# Patient Record
Sex: Female | Born: 1960 | Race: White | Hispanic: No | Marital: Single | Smoking: Never smoker
Health system: Southern US, Community
[De-identification: ages and names within clinical notes are randomized; demographics above are authoritative.]

## PROBLEM LIST (undated history)

## (undated) DIAGNOSIS — R011 Cardiac murmur, unspecified: Secondary | ICD-10-CM

## (undated) DIAGNOSIS — I10 Essential (primary) hypertension: Secondary | ICD-10-CM

## (undated) HISTORY — PX: OTHER SURGICAL HISTORY: SHX169

## (undated) HISTORY — DX: Cardiac murmur, unspecified: R01.1

---

## 2015-10-13 ENCOUNTER — Encounter: Payer: Self-pay | Admitting: Cardiology

## 2015-10-17 ENCOUNTER — Emergency Department
Admission: EM | Admit: 2015-10-17 | Discharge: 2015-10-18 | Disposition: A | Discharge status: Home / Self Care | Payer: BLUE CROSS/BLUE SHIELD | Attending: Emergency Medicine | Admitting: Emergency Medicine

## 2015-10-17 ENCOUNTER — Emergency Department: Payer: BLUE CROSS/BLUE SHIELD

## 2015-10-17 ENCOUNTER — Encounter: Payer: Self-pay | Admitting: Emergency Medicine

## 2015-10-17 DIAGNOSIS — Z79899 Other long term (current) drug therapy: Secondary | ICD-10-CM | POA: Insufficient documentation

## 2015-10-17 DIAGNOSIS — I10 Essential (primary) hypertension: Secondary | ICD-10-CM | POA: Insufficient documentation

## 2015-10-17 DIAGNOSIS — R079 Chest pain, unspecified: Secondary | ICD-10-CM | POA: Diagnosis not present

## 2015-10-17 HISTORY — DX: Essential (primary) hypertension: I10

## 2015-10-17 LAB — BASIC METABOLIC PANEL
Anion gap: 7 (ref 5–15)
BUN: 14 mg/dL (ref 6–20)
CALCIUM: 9.5 mg/dL (ref 8.9–10.3)
CO2: 26 mmol/L (ref 22–32)
CREATININE: 0.54 mg/dL (ref 0.44–1.00)
Chloride: 103 mmol/L (ref 101–111)
GFR calc Af Amer: >60 mL/min (ref >60)
GFR calc non Af Amer: >60 mL/min (ref >60)
GLUCOSE: 104 mg/dL — AB (ref 65–99)
Potassium: 3.6 mmol/L (ref 3.5–5.1)
Sodium: 136 mmol/L (ref 135–145)

## 2015-10-17 LAB — CBC
HCT: 39.8 % (ref 35.0–47.0)
Hemoglobin: 13.3 g/dL (ref 12.0–16.0)
MCH: 30.5 pg (ref 26.0–34.0)
MCHC: 33.5 g/dL (ref 32.0–36.0)
MCV: 91 fL (ref 80.0–100.0)
PLATELETS: 241 10*3/uL (ref 150–440)
RBC: 4.37 MIL/uL (ref 3.80–5.20)
RDW: 13.4 % (ref 11.5–14.5)
WBC: 7.7 10*3/uL (ref 3.6–11.0)

## 2015-10-17 NOTE — ED Provider Notes (Signed)
[facility] Emergency Department Provider Note  ____________________________________________  Time seen: Approximately 11:55 PM  I have reviewed the triage vital signs and the nursing notes.   HISTORY  Chief Complaint Chest Pain   HPI Amber Glover is a 55 y.o. [place]. Past medical history is hypertension for which she takes valsartan. Today at lunch she began feeling shaky and nauseated and woozy. Blood pressure was elevated at about 4:00 she developed chest pressure with no radiation. She went to lay down in bed for about 20 minutes blood pressure came down a little bit she took a second valsartan pressure has since resolved. Patient has no history of shortness of breath or sweating.    Past Medical History  Diagnosis Date  . Hypertension     There are no active problems to display for this patient.   History reviewed. No pertinent past surgical history.  Current Outpatient Rx  Name  Route  Sig  Dispense  Refill  . amLODipine (NORVASC) 5 MG tablet   Oral   Take 1 tablet by mouth daily.      1   . valsartan (DIOVAN) 80 MG tablet   Oral   Take 1 tablet by mouth daily.      2     Allergies Caffeine  History reviewed. No pertinent family history.  Social History Social History  Substance Use Topics  . Smoking status: Never Smoker   . Smokeless tobacco: None  . Alcohol Use: None   family history significant for father having had a stroke in his 6750s  Review of Systems Constitutional: No fever/chills Eyes: No visual changes. ENT: No sore throat. Cardiovascular: Denies chest pain at present. Respiratory: Denies shortness of breath. Gastrointestinal: No abdominal pain.  No nausea, no vomiting.  No diarrhea.  No constipation. Genitourinary: Negative for dysuria. Musculoskeletal: Negative for back pain. Skin: Negative for rash. Neurological: Negative for headaches, focal weakness or  numbness.  10-point ROS otherwise negative.  ____________________________________________   PHYSICAL EXAM:  VITAL SIGNS: ED Triage Vitals  Enc Vitals Group     BP 10/17/15 1928 155/72 mmHg     Pulse Rate 10/17/15 1928 81     Resp 10/17/15 1928 18     Temp 10/17/15 1928 98.4 F (36.9 C)     Temp Source 10/17/15 1928 Oral     SpO2 10/17/15 1928 99 %     Weight 10/17/15 1926 113 lb (51.256 kg)     Height 10/17/15 1926 5\' 2"  (1.575 m)     Head Cir --      Peak Flow --      Pain Score 10/17/15 1927 5     Pain Loc --      Pain Edu? --      Excl. in GC? --     Constitutional: Alert and oriented. Well appearing and in no acute distress. Eyes: Conjunctivae are normal. PERRL. EOMI. Head: Atraumatic. Nose: No congestion/rhinnorhea. Mouth/Throat: Mucous membranes are moist.  Oropharynx non-erythematous. Neck: No stridor.   Cardiovascular: Normal rate, regular rhythm. Grossly normal heart sounds.  Good peripheral circulation. Respiratory: Normal respiratory effort.  No retractions. Lungs CTAB. Gastrointestinal: Soft and nontender. No distention. No abdominal bruits. No CVA tenderness. Musculoskeletal: No lower extremity tenderness nor edema.  No joint effusions. Neurologic:  Normal speech and language. No gross focal neurologic deficits are appreciated.     ____________________________________________   LABS (all labs ordered are listed, but only abnormal results  are displayed)  Labs Reviewed  BASIC METABOLIC PANEL - Abnormal; Notable for the following:    Glucose, Bld 104 (*)    All other components within normal limits  CBC  TROPONIN I   ____________________________________________  EKG  EKG read and interpreted by me shows normal sinus rhythm rate of 68 normal axis essentially normal EKG ____________________________________________  RADIOLOGY  Radiology reads the chest x-ray as no acute  disease ____________________________________________   PROCEDURES    ____________________________________________   INITIAL IMPRESSION / ASSESSMENT AND PLAN / ED COURSE  Pertinent labs & imaging results that were available during my care of the patient were reviewed by me and considered in my medical decision making (see chart for details).   ____________________________________________   FINAL CLINICAL IMPRESSION(S) / ED DIAGNOSES  Final diagnoses:  Chest pain, unspecified chest pain type      Arnaldo Natal, MD 10/21/15 1121

## 2015-10-17 NOTE — ED Notes (Signed)
Patient reports feeling "funny" - shaking, nausea and dizzy right after lunch.  Approximately 1 hour prior to arrival began having left chest pressure with continued nausea.  Patient reports recent decrease in blood pressure medicine.

## 2015-10-18 LAB — TROPONIN I

## 2015-10-20 ENCOUNTER — Telehealth: Payer: Self-pay | Admitting: Cardiovascular Disease

## 2015-10-20 NOTE — Telephone Encounter (Signed)
LMOV for patient to call back and make ED fu from 10/17/15

## 2015-11-06 ENCOUNTER — Ambulatory Visit: Payer: BLUE CROSS/BLUE SHIELD | Admitting: Cardiology

## 2015-11-20 ENCOUNTER — Encounter (INDEPENDENT_AMBULATORY_CARE_PROVIDER_SITE_OTHER): Payer: Self-pay

## 2015-11-20 ENCOUNTER — Encounter: Payer: Self-pay | Admitting: Cardiology

## 2015-11-20 ENCOUNTER — Ambulatory Visit (INDEPENDENT_AMBULATORY_CARE_PROVIDER_SITE_OTHER): Payer: BLUE CROSS/BLUE SHIELD | Admitting: Cardiology

## 2015-11-20 VITALS — BP 140/72 | HR 73 | Ht 62.0 in | Wt 110.5 lb

## 2015-11-20 DIAGNOSIS — R079 Chest pain, unspecified: Secondary | ICD-10-CM | POA: Diagnosis not present

## 2015-11-20 DIAGNOSIS — I10 Essential (primary) hypertension: Secondary | ICD-10-CM

## 2015-11-20 DIAGNOSIS — R011 Cardiac murmur, unspecified: Secondary | ICD-10-CM | POA: Diagnosis not present

## 2015-11-20 NOTE — Progress Notes (Signed)
Cardiology Office Note   Date:  11/20/2015   ID:  Amber Glover, DOB 18-Jul-1961, MRN [id number]  Referring Doctor:  Lanier Ensign KEY, MD   Cardiologist:   Almond Lint, MD   Reason for consultation:  Chief Complaint  Patient presents with  . other    F/u ED due to chest pain no complaints today. Meds reviewed verbally with pt.      History of Present Illness: Amber Glover is a 55 y.o. female who presents for evaluation of chest pain, at the request PCP.recently, in the last several months, patient has noticed episodes were in her blood pressure all of a sudden shoot up to 190s to 200s systolic. When this happens she develops tightness in the chest, nonradiating, mild in intensity, lasts minutes at a time period resolves her blood pressure improves. She cannot really explain why her blood pressure shoots up like that. She never had anything like this before. She feels that this is probably stress related. She is in the process of looking for a house to live. She moved from [place] recently and currently she is living with a friend.  Patient denies headache, fever, cough, colds. No denies shortness of breath other than when she rides a horse. She feels her shortness breath is in proportion to the level of exertion. No PND, orthopnea, edema. No loss of consciousness.   ROS:  Please see the history of present illness. Aside from mentioned under HPI, all other systems are reviewed and negative.     Past Medical History  Diagnosis Date  . Hypertension   . Heart murmur     Past Surgical History  Procedure Laterality Date  . Cesarean section    . Uterine ablation       reports that she has never smoked. She does not have any smokeless tobacco history on file. She reports that she drinks alcohol. She reports that she does not use illicit drugs.   family history includes Heart Problems in her father. father had a defibrillator. She isn't aware exactly his diagnosis.  Current  Outpatient Prescriptions  Medication Sig Dispense Refill  . Ascorbic Acid (VITAMIN C PO) Take by mouth daily.    . cholecalciferol (VITAMIN D) 1000 units tablet Take 1,000 Units by mouth daily.    . Multiple Vitamin (MULTIVITAMIN) tablet Take 1 tablet by mouth daily.    . NON FORMULARY Relief Takes 1 tablet by mouth daily.    . valsartan (DIOVAN) 80 MG tablet Take 1 tablet by mouth daily.  2   No current facility-administered medications for this visit.    Allergies: Caffeine    PHYSICAL EXAM: VS:  BP 140/72 mmHg  Pulse 73  Ht  (1.575 m)  Wt 110 lb 8 oz (50.122 kg)  BMI 20.21 kg/m2 , Body mass index is 20.21 kg/(m^2). Wt Readings from Last 3 Encounters:  11/20/15 110 lb 8 oz (50.122 kg)  10/17/15 113 lb (51.256 kg)    GENERAL:  well developed, well nourished, not in acute distress HEENT: normocephalic, pink conjunctivae, anicteric sclerae, no xanthelasma, normal dentition, oropharynx clear NECK:  no neck vein engorgement, JVP normal, no hepatojugular reflux, carotid upstroke brisk and symmetric, no bruit, no thyromegaly, no lymphadenopathy LUNGS:  good respiratory effort, clear to auscultation bilaterally CV:  PMI not displaced, no thrills, no lifts, S1 and S2 within normal limits, no palpable S3 or S4, no murmurs, no rubs, no gallops, Soft systolic murmur ABD:  Soft, nontender, nondistended, normoactive bowel  sounds, no abdominal aortic bruit, no hepatomegaly, no splenomegaly MS: nontender back, no kyphosis, no scoliosis, no joint deformities EXT:  2+ DP/PT pulses, no edema, no varicosities, no cyanosis, no clubbing SKIN: warm, nondiaphoretic, normal turgor, no ulcers NEUROPSYCH: alert, oriented to person, place, and time, sensory/motor grossly intact, normal mood, appropriate affect  Recent Labs: 10/17/2015: BUN 14; Creatinine, Ser 0.54; Hemoglobin 13.3; Platelets 241; Potassium 3.6; Sodium 136   Lipid Panel No results found for: CHOL, TRIG, HDL, CHOLHDL, VLDL, LDLCALC,  LDLDIRECT   Other studies Reviewed:  EKG:  The ekg from 10/18/2015 was personally reviewed by me and it revealed sinus rhythm, 60 BPM.  Additional studies/ records that were reviewed personally reviewed by me today include: none available   ASSESSMENT AND PLAN:  Chest pain Atypical, may be related to elevated blood pressure Risk factors for coronary disease include hypertension, question of family history. Recommend echocardiogram and exercise stress test for reassurance. Patient to follow-up after tests.  Systolic murmur Recommend echocardiogram  Hypertension Continue monitoring BP. Continue current medical therapy and lifestyle changes.  Current medicines are reviewed at length with the patient today.  The patient does not have concerns regarding medicines.  Labs/ tests ordered today include:  Orders Placed This Encounter  Procedures  . NM Myocar Multi W/Spect W/Wall Motion / EF  . Echocardiogram    I had a lengthy and detailed discussion with the patient regarding diagnoses, prognosis, diagnostic options, treatment options .   I counseled the patient on importance of lifestyle modification including heart healthy diet, regular physical activity.   Disposition:   FU with undersigned after tests    Signed, Almond LintAileen Micala Saltsman, MD  11/20/2015 3:28 PM    [facility]

## 2015-11-20 NOTE — Patient Instructions (Addendum)
Medication Instructions:  Your physician recommends that you continue on your current medications as directed. Please refer to the Current Medication list given to you today.   Labwork: None ordered  Testing/Procedures: Your physician has requested that you have an echocardiogram. Echocardiography is a painless test that uses sound waves to create images of your heart. It provides your doctor with information about the size and shape of your heart and how well your heart's chambers and valves are working. This procedure takes approximately one hour. There are no restrictions for this procedure.  Date & Time:_________________________________________________________  [facility] MYOVIEW  Your caregiver has ordered a Stress Test with nuclear imaging. The purpose of this test is to evaluate the blood supply to your heart muscle. This procedure is referred to as a "Non-Invasive Stress Test." This is because other than having an IV started in your vein, nothing is inserted or "invades" your body. Cardiac stress tests are done to find areas of poor blood flow to the heart by determining the extent of coronary artery disease (CAD). Some patients exercise on a treadmill, which naturally increases the blood flow to your heart, while others who are  unable to walk on a treadmill due to physical limitations have a pharmacologic/chemical stress agent called Lexiscan . This medicine will mimic walking on a treadmill by temporarily increasing your coronary blood flow.   Please note: these test may take anywhere between 2-4 hours to complete  PLEASE REPORT TO [facility] MEDICAL MALL ENTRANCE  THE VOLUNTEERS AT THE FIRST DESK WILL DIRECT YOU WHERE TO GO  Date of Procedure:___Monday Dec 08, 2015 at 07:30AM_________  Arrival Time for Procedure:_____Arrive at 07:30AM______________   PLEASE NOTIFY THE OFFICE AT LEAST 24 HOURS IN ADVANCE IF YOU ARE UNABLE TO KEEP YOUR APPOINTMENT.  [phone] AND  PLEASE NOTIFY NUCLEAR  MEDICINE AT [facility] AT LEAST 24 HOURS IN ADVANCE IF YOU ARE UNABLE TO KEEP YOUR APPOINTMENT. [phone]  How to prepare for your Myoview test:   Do not eat or drink after midnight  No caffeine for 24 hours prior to test  No smoking 24 hours prior to test.  Your medication may be taken with water.  If your doctor stopped a medication because of this test, do not take that medication.  Ladies, please do not wear dresses.  Skirts or pants are appropriate. Please wear a short sleeve shirt.  No perfume, cologne or lotion.  Wear comfortable walking shoes. No heels!        Follow-Up: Your physician recommends that you schedule a follow-up appointment after testing to review results.  Date & Time: _______________________________________________________________  Any Other Special Instructions Will Be Listed Below (If Applicable).     If you need a refill on your cardiac medications before your next appointment, please call your pharmacy.  Echocardiogram An echocardiogram, or echocardiography, uses sound waves (ultrasound) to produce an image of your heart. The echocardiogram is simple, painless, obtained within a short period of time, and offers valuable information to your health care provider. The images from an echocardiogram can provide information such as:  Evidence of coronary artery disease (CAD).  Heart size.  Heart muscle function.  Heart valve function.  Aneurysm detection.  Evidence of a past heart attack.  Fluid buildup around the heart.  Heart muscle thickening.  Assess heart valve function. LET [facility] CARE PROVIDER KNOW ABOUT:  Any allergies you have.  All medicines you are taking, including vitamins, herbs, eye drops, creams, and over-the-counter medicines.  Previous problems  you or members of your family have had with the use of anesthetics.  Any blood disorders you have.  Previous surgeries you have had.  Medical conditions you  have.  Possibility of pregnancy, if this applies. BEFORE THE PROCEDURE  No special preparation is needed. Eat and drink normally.  PROCEDURE   In order to produce an image of your heart, gel will be applied to your chest and a wand-like tool (transducer) will be moved over your chest. The gel will help transmit the sound waves from the transducer. The sound waves will harmlessly bounce off your heart to allow the heart images to be captured in real-time motion. These images will then be recorded.  You may need an IV to receive a medicine that improves the quality of the pictures. AFTER THE PROCEDURE You may return to your normal schedule including diet, activities, and medicines, unless your health care provider tells you otherwise.   This information is not intended to replace advice given to you by your health care provider. Make sure you discuss any questions you have with your health care provider.   Document Released: 07/16/2000 Document Revised: 08/09/2014 Document Reviewed: 03/26/2013 Elsevier Interactive Patient Education 2016 Elsevier Inc.   Cardiac Nuclear Scanning A cardiac nuclear scan is used to check your heart for problems, such as the following:  A portion of the heart is not getting enough blood.  Part of the heart muscle has died, which happens with a heart attack.  The heart wall is not working normally.  In this test, a radioactive dye (tracer) is injected into your bloodstream. After the tracer has traveled to your heart, a scanning device is used to measure how much of the tracer is absorbed by or distributed to various areas of your heart. LET [facility] CARE PROVIDER KNOW ABOUT:  Any allergies you have.  All medicines you are taking, including vitamins, herbs, eye drops, creams, and over-the-counter medicines.  Previous problems you or members of your family have had with the use of anesthetics.  Any blood disorders you have.  Previous surgeries you  have had.  Medical conditions you have.  RISKS AND COMPLICATIONS Generally, this is a safe procedure. However, as with any procedure, problems can occur. Possible problems include:   Serious chest pain.  Rapid heartbeat.  Sensation of warmth in your chest. This usually passes quickly. BEFORE THE PROCEDURE Ask your health care provider about changing or stopping your regular medicines. PROCEDURE This procedure is usually done at a hospital and takes 2-4 hours.  An IV tube is inserted into one of your veins.  Your health care provider will inject a small amount of radioactive tracer through the tube.  You will then wait for 20-40 minutes while the tracer travels through your bloodstream.  You will lie down on an exam table so images of your heart can be taken. Images will be taken for about 15-20 minutes.  You will exercise on a treadmill or stationary bike. While you exercise, your heart activity will be monitored with an electrocardiogram (ECG), and your blood pressure will be checked.  If you are unable to exercise, you may be given a medicine to make your heart beat faster.  When blood flow to your heart has peaked, tracer will again be injected through the IV tube.  After 20-40 minutes, you will get back on the exam table and have more images taken of your heart.  When the procedure is over, your IV tube will be  removed. AFTER THE PROCEDURE  You will likely be able to leave shortly after the test. Unless your health care provider tells you otherwise, you may return to your normal schedule, including diet, activities, and medicines.  Make sure you find out how and when you will get your test results.   This information is not intended to replace advice given to you by your health care provider. Make sure you discuss any questions you have with your health care provider.   Document Released: 08/13/2004 Document Revised: 07/24/2013 Document Reviewed: 06/27/2013 Elsevier  Interactive Patient Education [employer].

## 2015-11-26 ENCOUNTER — Other Ambulatory Visit: Payer: BLUE CROSS/BLUE SHIELD

## 2015-12-02 ENCOUNTER — Ambulatory Visit: Payer: BLUE CROSS/BLUE SHIELD | Admitting: Cardiology

## 2015-12-08 ENCOUNTER — Other Ambulatory Visit: Payer: BLUE CROSS/BLUE SHIELD

## 2015-12-09 ENCOUNTER — Ambulatory Visit (INDEPENDENT_AMBULATORY_CARE_PROVIDER_SITE_OTHER): Payer: BLUE CROSS/BLUE SHIELD

## 2015-12-09 ENCOUNTER — Other Ambulatory Visit: Payer: Self-pay

## 2015-12-09 DIAGNOSIS — R079 Chest pain, unspecified: Secondary | ICD-10-CM

## 2015-12-09 DIAGNOSIS — I10 Essential (primary) hypertension: Secondary | ICD-10-CM | POA: Diagnosis not present

## 2015-12-09 DIAGNOSIS — R011 Cardiac murmur, unspecified: Secondary | ICD-10-CM | POA: Diagnosis not present

## 2015-12-11 ENCOUNTER — Ambulatory Visit: Payer: BLUE CROSS/BLUE SHIELD | Admitting: Cardiology

## 2015-12-15 ENCOUNTER — Ambulatory Visit: Payer: BLUE CROSS/BLUE SHIELD

## 2015-12-24 ENCOUNTER — Ambulatory Visit: Payer: BLUE CROSS/BLUE SHIELD | Admitting: Cardiology

## 2017-06-30 ENCOUNTER — Other Ambulatory Visit: Payer: Self-pay

## 2017-06-30 ENCOUNTER — Emergency Department: Payer: BLUE CROSS/BLUE SHIELD

## 2017-06-30 ENCOUNTER — Emergency Department
Admission: EM | Admit: 2017-06-30 | Discharge: 2017-06-30 | Disposition: A | Discharge status: Home / Self Care | Payer: BLUE CROSS/BLUE SHIELD | Attending: Emergency Medicine | Admitting: Emergency Medicine

## 2017-06-30 DIAGNOSIS — Z79899 Other long term (current) drug therapy: Secondary | ICD-10-CM | POA: Insufficient documentation

## 2017-06-30 DIAGNOSIS — W19XXXA Unspecified fall, initial encounter: Secondary | ICD-10-CM | POA: Diagnosis not present

## 2017-06-30 DIAGNOSIS — Y939 Activity, unspecified: Secondary | ICD-10-CM | POA: Insufficient documentation

## 2017-06-30 DIAGNOSIS — Y999 Unspecified external cause status: Secondary | ICD-10-CM | POA: Diagnosis not present

## 2017-06-30 DIAGNOSIS — S0990XA Unspecified injury of head, initial encounter: Secondary | ICD-10-CM | POA: Diagnosis present

## 2017-06-30 DIAGNOSIS — S0083XA Contusion of other part of head, initial encounter: Secondary | ICD-10-CM

## 2017-06-30 DIAGNOSIS — Y929 Unspecified place or not applicable: Secondary | ICD-10-CM | POA: Insufficient documentation

## 2017-06-30 DIAGNOSIS — S0003XA Contusion of scalp, initial encounter: Secondary | ICD-10-CM | POA: Insufficient documentation

## 2017-06-30 NOTE — ED Provider Notes (Signed)
[facility] Emergency Department Provider Note   ____________________________________________   None    (approximate)  I have reviewed the triage vital signs and the nursing notes.   HISTORY  Chief Complaint Fall and Head Injury    HPI Amber Glover is a 56 y.o. female patient complain right facial pain and numbness to the top or his secondary to a trip and fall 4 days ago. Patient denies LOC. Patient denies vision disturbance or vertigo. Patient stated this tenderness inferior right orbital area. Receiving hematoma to the right supraorbital area.Patient rates the pain as a 4/10. Patient described it as "achy". Supportive measures consist of Tylenol and ice packs.   Past Medical History:  Diagnosis Date  . Heart murmur   . Hypertension     There are no active problems to display for this patient.   Past Surgical History:  Procedure Laterality Date  . CESAREAN SECTION    . uterine ablation      Prior to Admission medications   Medication Sig Start Date End Date Taking? Authorizing Provider  Ascorbic Acid (VITAMIN C PO) Take by mouth daily.    [provider]  cholecalciferol (VITAMIN D) 1000 units tablet Take 1,000 Units by mouth daily.    [provider]  Multiple Vitamin (MULTIVITAMIN) tablet Take 1 tablet by mouth daily.    [provider]  NON FORMULARY Relief Takes 1 tablet by mouth daily.    [provider]  valsartan (DIOVAN) 80 MG tablet Take 1 tablet by mouth daily. 10/13/15   [provider]    Allergies Caffeine  Family History  Problem Relation Age of Onset  . Heart Problems Father     Social History Social History   Tobacco Use  . Smoking status: Never Smoker  . Smokeless tobacco: Never Used  Substance Use Topics  . Alcohol use: Yes    Comment: social  . Drug use: No    Review of Systems  Constitutional: No fever/chills Eyes: No visual changes. ENT: No sore  throat. Cardiovascular: Denies chest pain. Respiratory: Denies shortness of breath. Gastrointestinal: No abdominal pain.  No nausea, no vomiting.  No diarrhea.  No constipation. Genitourinary: Negative for dysuria. Musculoskeletal: Negative for back pain. Skin: Negative for rash. Neurological: Negative for headaches, focal weakness or numbness. Endocrine:Hypertension Allergic/Immunilogical: Caffeine ____________________________________________   PHYSICAL EXAM:  VITAL SIGNS: ED Triage Vitals  Enc Vitals Group     BP 06/30/17 0928 (!) 164/71     Pulse Rate 06/30/17 0928 80     Resp 06/30/17 0928 17     Temp 06/30/17 0928 98 F (36.7 C)     Temp Source 06/30/17 0928 Tympanic     SpO2 06/30/17 0928 98 %     Weight 06/30/17 0928 100 lb (45.4 kg)     Height 06/30/17 0928 5\' 2"  (1.575 m)     Head Circumference --      Peak Flow --      Pain Score 06/30/17 0927 4     Pain Loc --      Pain Edu? --      Excl. in GC? --    Constitutional: Alert and oriented. Well appearing and in no acute distress. Eyes: Conjunctivae are normal. PERRL. EOMI. Head: Atraumatic. Nose: No congestion/rhinnorhea. Mouth/Throat: Mucous membranes are moist.  Oropharynx non-erythematous. Neck: No stridor.  No cervical spine tenderness to palpation. Hematological/Lymphatic/Immunilogical: No cervical lymphadenopathy. Cardiovascular: Normal rate, regular rhythm. Grossly normal heart sounds.  Good peripheral  circulation. Respiratory: Normal respiratory effort.  No retractions. Lungs CTAB. Neurologic:  Normal speech and language. No gross focal neurologic deficits are appreciated. No gait instability. Skin:  Skin is warm, dry and intact. No rash noted. Right inferior orbital ecchymosis Psychiatric: Mood and affect are normal. Speech and behavior are normal.  ____________________________________________   LABS (all labs ordered are listed, but only abnormal results are displayed)  Labs Reviewed - No data to  display ____________________________________________  EKG   ____________________________________________  RADIOLOGY  Ct Head Wo Contrast  Result Date: 06/30/2017 CLINICAL DATA:  Injury to the right side of the forehead due to a ground level fall the night of 06/25/2017. Initial encounter. EXAM: CT HEAD WITHOUT CONTRAST CT MAXILLOFACIAL WITHOUT CONTRAST TECHNIQUE: Multidetector CT imaging of the head and maxillofacial structures were performed using the standard protocol without intravenous contrast. Multiplanar CT image reconstructions of the maxillofacial structures were also generated. COMPARISON:  None. FINDINGS: CT HEAD FINDINGS Brain: No acute abnormality including hemorrhage, infarct, mass lesion, mass effect, midline shift or abnormal extra-axial fluid collection is identified. No hydrocephalus or pneumocephalus. Vascular: No hyperdense vessel or unexpected calcification. Skull: Intact. Other: None. CT MAXILLOFACIAL FINDINGS Osseous: No fracture or mandibular dislocation. No destructive process. Mild reversal of the normal cervical lordosis is noted. Orbits: Negative. No traumatic or inflammatory finding. Sinuses: Clear. Soft tissues: No radiopaque foreign body or soft tissue gas. IMPRESSION: No acute abnormality head or cervical spine. Electronically Signed   By: Drusilla Kannerhomas  Dalessio M.D.   On: 06/30/2017 11:13   Ct Maxillofacial Wo Contrast  Result Date: 06/30/2017 CLINICAL DATA:  Injury to the right side of the forehead due to a ground level fall the night of 06/25/2017. Initial encounter. EXAM: CT HEAD WITHOUT CONTRAST CT MAXILLOFACIAL WITHOUT CONTRAST TECHNIQUE: Multidetector CT imaging of the head and maxillofacial structures were performed using the standard protocol without intravenous contrast. Multiplanar CT image reconstructions of the maxillofacial structures were also generated. COMPARISON:  None. FINDINGS: CT HEAD FINDINGS Brain: No acute abnormality including hemorrhage, infarct,  mass lesion, mass effect, midline shift or abnormal extra-axial fluid collection is identified. No hydrocephalus or pneumocephalus. Vascular: No hyperdense vessel or unexpected calcification. Skull: Intact. Other: None. CT MAXILLOFACIAL FINDINGS Osseous: No fracture or mandibular dislocation. No destructive process. Mild reversal of the normal cervical lordosis is noted. Orbits: Negative. No traumatic or inflammatory finding. Sinuses: Clear. Soft tissues: No radiopaque foreign body or soft tissue gas. IMPRESSION: No acute abnormality head or cervical spine. Electronically Signed   By: Drusilla Kannerhomas  Dalessio M.D.   On: 06/30/2017 11:13    ____________________________________________   PROCEDURES  Procedure(s) performed: None  Procedures  Critical Care performed: No  ____________________________________________   INITIAL IMPRESSION / ASSESSMENT AND PLAN / ED COURSE  As part of my medical decision making, I reviewed the following data within the electronic MEDICAL RECORD NUMBER    Pain secondary to facial scalp contusion. Discussed negative CT finding with patient. Patient given discharge care instructions and advised to take extra Tylenol for pain. Patient advised to follow up PCP.      ____________________________________________   FINAL CLINICAL IMPRESSION(S) / ED DIAGNOSES  Final diagnoses:  Contusion of face, initial encounter  Contusion of scalp, initial encounter     ED Discharge Orders    None       Note:  This document was prepared using Dragon voice recognition software and may include unintentional dictation errors.    Joni ReiningSmith, Aloise Copus K, PA-C 06/30/17 1150    Jene EveryKinner, Robert, MD 06/30/17  1310  

## 2017-06-30 NOTE — ED Triage Notes (Addendum)
Pt states she tripped and fell into the side of the house Saturday night denies LOC. States since she has noticed numbness to the top of her head . Denies visual changes, N/V or any other neuro sx..Marland Kitchen

## 2017-06-30 NOTE — ED Notes (Signed)
See triage note  States she fell hit her head last Friday  Bruising noted around right eye and cheek area  States she is still having h/a  Mainly to top of head and states area feels numb

## 2017-06-30 NOTE — Discharge Instructions (Signed)
Advised to continue supportive care and take extra strength Tylenol for headache pain.

## 2018-01-06 IMAGING — CT CT HEAD W/O CM
3 of 6 series · 16 of 47 positions shown, 19 images · non-contrast
Comparison: None.

CLINICAL DATA: Injury to the right side of the forehead due to a
ground level fall the night of 06/25/2017. Initial encounter.

EXAM:
CT HEAD WITHOUT CONTRAST
CT MAXILLOFACIAL WITHOUT CONTRAST
TECHNIQUE: Multidetector CT imaging of the head and maxillofacial structures
were performed using the standard protocol without intravenous
contrast. Multiplanar CT image reconstructions of the maxillofacial
structures were also generated.

[Series 6: max soft · axial · 0.27mm/px · z∈[+430,+552]mm · 11 of 69 slices shown, 14 images]
[im 4/69  brain]
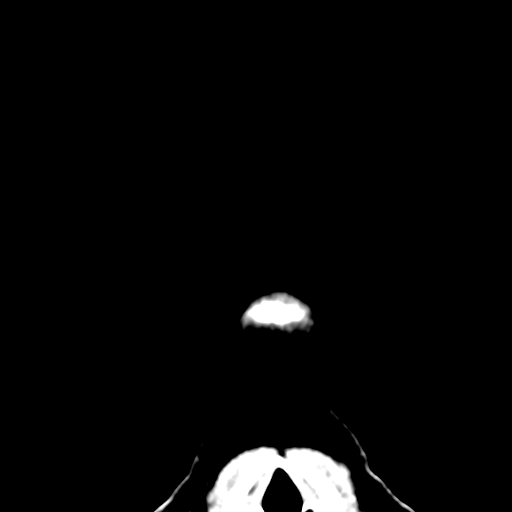
[im 4/69  bone]
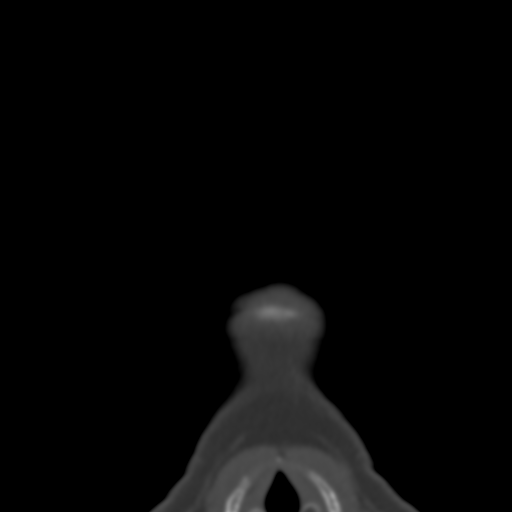
[im 10/69  brain]
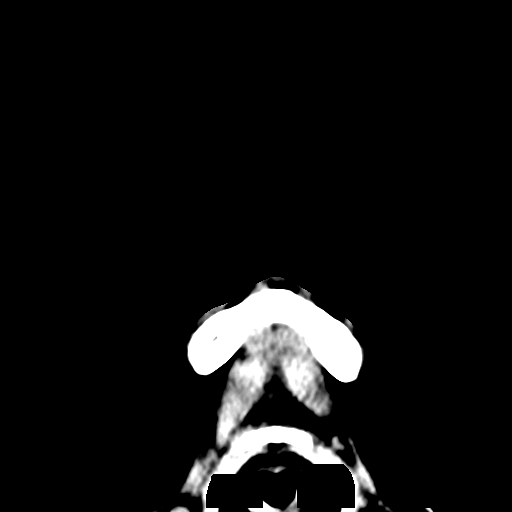
[im 17/69  brain]
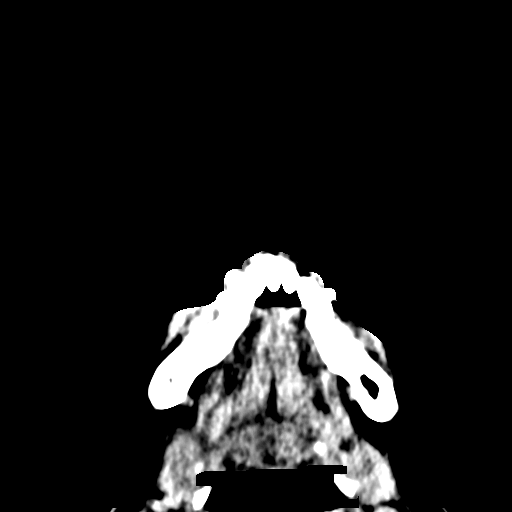
[im 23/69  brain]
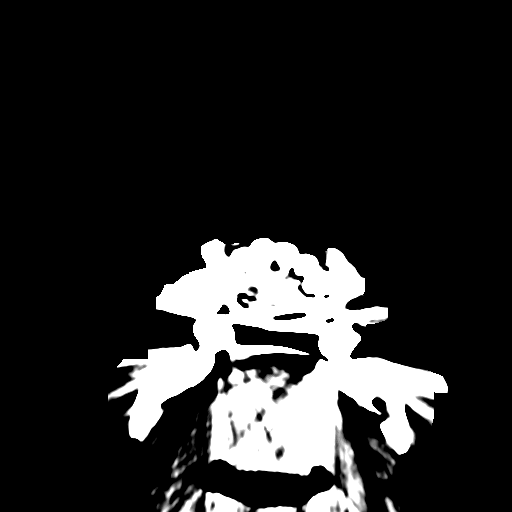
[im 30/69  brain]
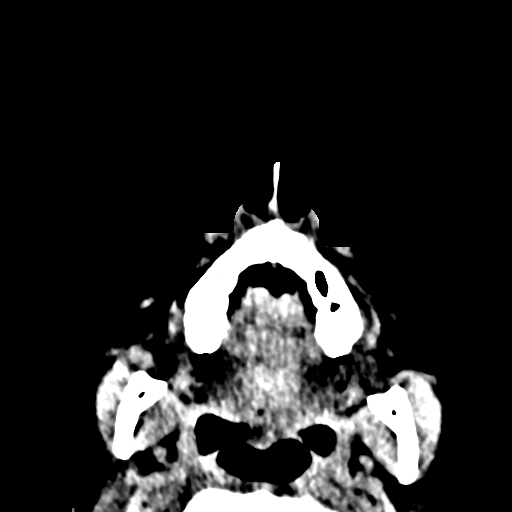
[im 30/69  bone]
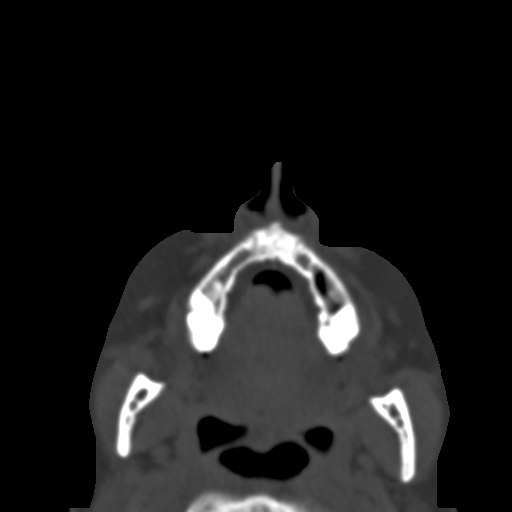
[im 36/69  brain]
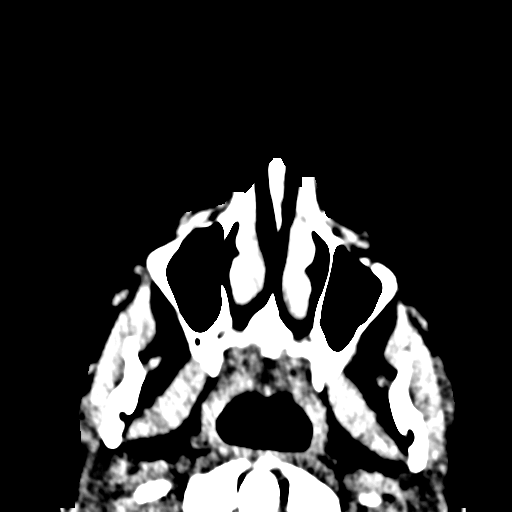
[im 39/69  brain]
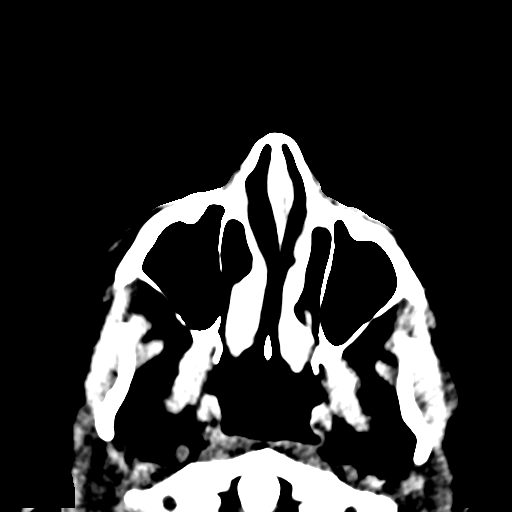
[im 46/69  brain]
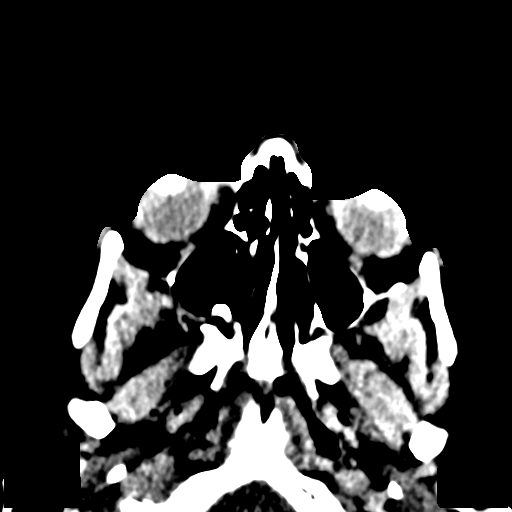
[im 52/69  brain]
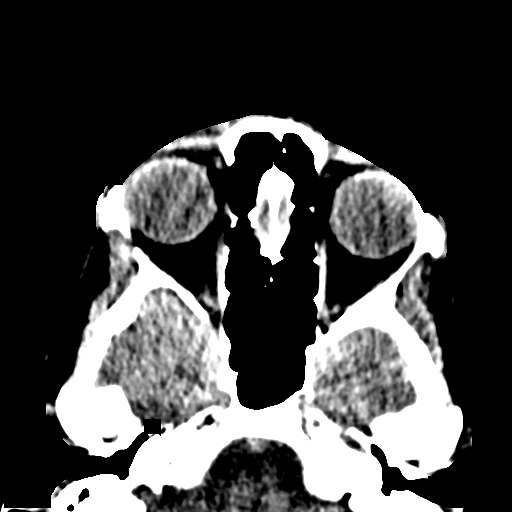
[im 52/69  bone]
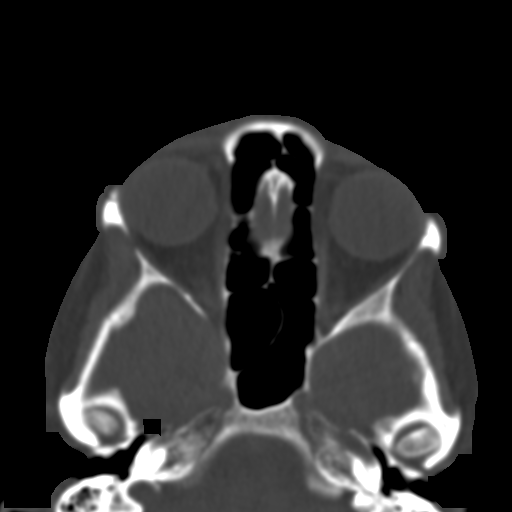
[im 59/69  brain]
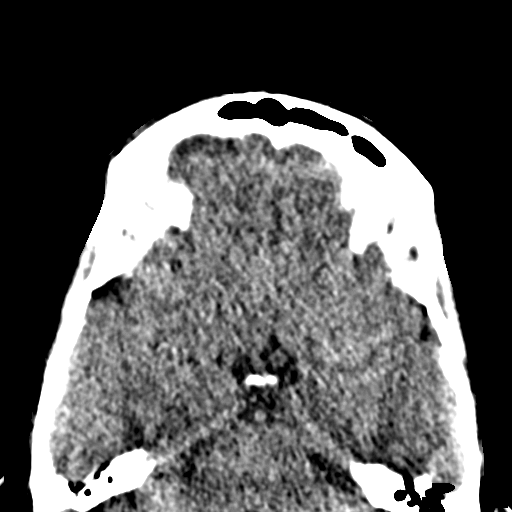
[im 65/69  brain]
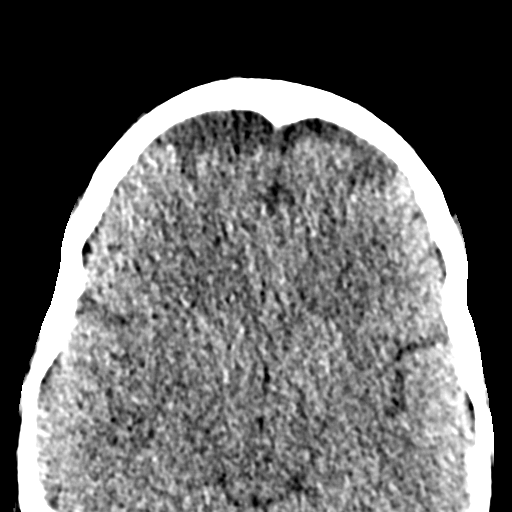

[Series 10: coronal soft · coronal · 0.35mm/px · 3 of 64 slices shown]
[im 8/64  brain]
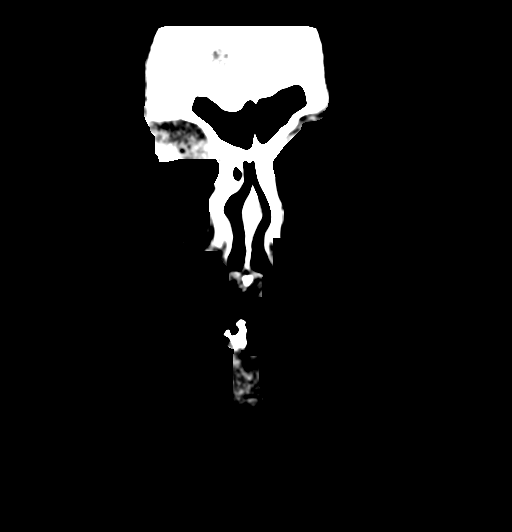
[im 22/64  brain]
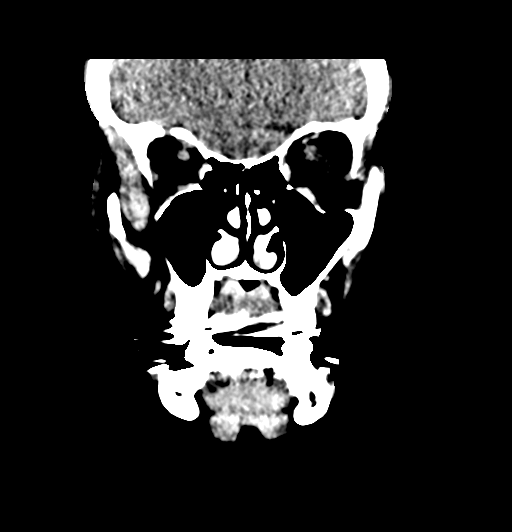
[im 36/64  brain]
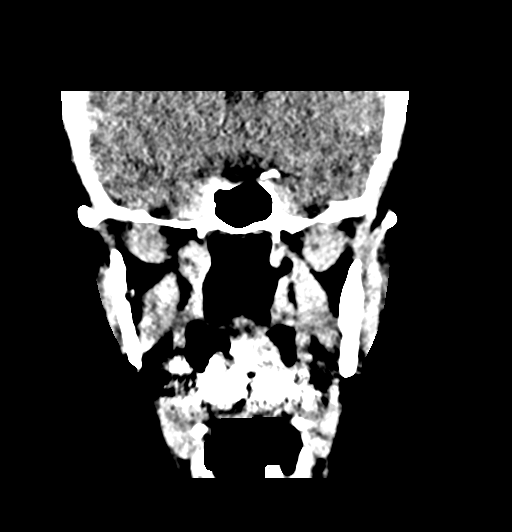

[Series 11: sagittal soft · sagittal · 0.33mm/px · 2 of 69 slices shown]
[im 23/69  brain]
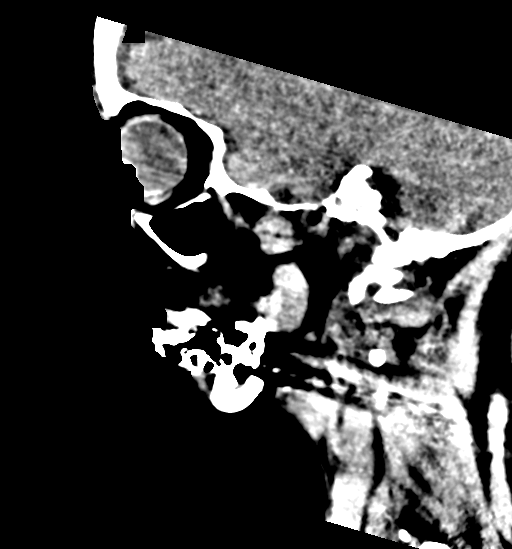
[im 46/69  brain]
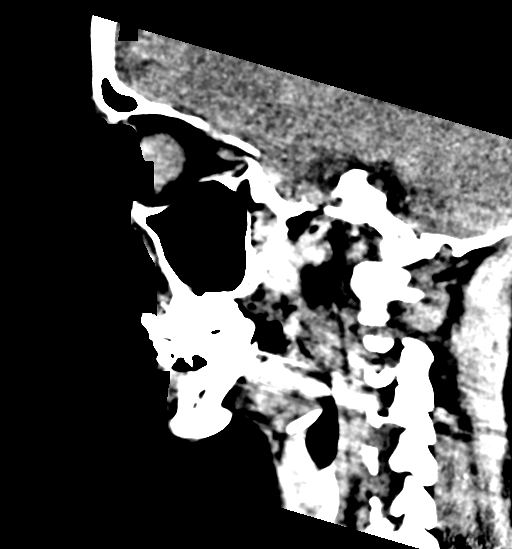

[16 of 47 positions shown; findings below may reference images not displayed]

FINDINGS: CT HEAD FINDINGS

Brain: No acute abnormality including hemorrhage, infarct, mass
lesion, mass effect, midline shift or abnormal extra-axial fluid
collection is identified. No hydrocephalus or pneumocephalus.

Vascular: No hyperdense vessel or unexpected calcification.

Skull: Intact.

Other: None.

CT MAXILLOFACIAL FINDINGS

Osseous: No fracture or mandibular dislocation. No destructive
process. Mild reversal of the normal cervical lordosis is noted.

Orbits: Negative. No traumatic or inflammatory finding.

Sinuses: Clear.

Soft tissues: No radiopaque foreign body or soft tissue gas.
IMPRESSION: No acute abnormality head or cervical spine.

## 2020-02-28 ENCOUNTER — Other Ambulatory Visit: Payer: Self-pay

## 2020-02-28 ENCOUNTER — Ambulatory Visit: Payer: BC Managed Care – PPO | Attending: Otolaryngology | Admitting: Speech Pathology

## 2020-02-28 DIAGNOSIS — R490 Dysphonia: Secondary | ICD-10-CM

## 2020-02-29 ENCOUNTER — Encounter: Payer: Self-pay | Admitting: Speech Pathology

## 2020-02-29 NOTE — Therapy (Signed)
[place] [facility] MAIN [facility] SERVICES [address] [place], [place], [address] Phone: [phone]   Fax:  [phone]  Speech Language Pathology Evaluation  Patient Details  Name: Amber Glover MRN: [id number] Date of Birth: 1961-03-04 Referring Provider (SLP): Bud Face   Encounter Date: 02/28/2020   End of Session - 02/29/20 1247    Visit Number 1    Number of Visits 17    Date for SLP Re-Evaluation 04/25/20    Authorization Type [employer]    Authorization Time Period 02/28/2020 - 04/25/2020    Authorization - Visit Number 1    Progress Note Due on Visit 10    SLP Start Time 1505    SLP Stop Time  1555    SLP Time Calculation (min) 50 min    Activity Tolerance Patient tolerated treatment well           Past Medical History:  Diagnosis Date  . Heart murmur   . Hypertension     Past Surgical History:  Procedure Laterality Date  . CESAREAN SECTION    . uterine ablation      There were no vitals filed for this visit.   Subjective Assessment - 02/29/20 1243    Subjective pt pleasant    Currently in Pain? No/denies              SLP Evaluation [facility] - 02/29/20 1243      SLP Visit Information   SLP Received On 02/28/20    Referring Provider (SLP) Bud Face    Onset Date 02/13/2020    Medical Diagnosis Dysphonia      Subjective   Subjective pt pleasant, conversant    Patient/Family Stated Goal for her voice to sound better      General Information   HPI This is a 59 year old female with compliant of chronic hoarseness.  Her hoarseness is described as loss of voice and raspy. Pt has been experiencing hoarseness for over a year and she loses her voice when talking for long periods of time and for several months last year she had a constant dry cough. Pt had ENT evaluation with laryngoscopy on 02/13/2020. 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      Behavioral/Cognition  appropriate, good historian    Mobility Status ambulatory      Balance Screen   Has the patient fallen in the past 6 months No    Has the patient had a decrease in activity level because of a fear of falling?  No    Is the patient reluctant to leave their home because of a fear of falling?  No      Prior Functional Status   Cognitive/Linguistic Baseline Within functional limits    Type of Home House     Lives With Family    Vocation Full time employment      Cognition   Overall Cognitive Status Within Functional Limits for tasks assessed      Auditory Comprehension   Overall Auditory Comprehension Appears within functional limits for tasks assessed      Reading Comprehension   Reading Status Within funtional limits      Expression   Primary Mode of Expression Verbal      Verbal Expression   Overall Verbal Expression Appears within functional limits for tasks assessed      Oral Motor/Sensory Function   Overall Oral Motor/Sensory Function Appears within functional limits for tasks assessed  Motor Speech   Overall Motor Speech Impaired    Respiration Within functional limits    Phonation Hoarse;Low vocal intensity    Resonance Within functional limits    Articulation Within functional limitis    Intelligibility Intelligible    Motor Planning Witnin functional limits    Motor Speech Errors Not applicable    Phonation Impaired    Vocal Abuses Prolonged Vocal Use    Tension Present Neck;Shoulder    Volume Soft    Pitch Appropriate           Voice Assessment:   Respiration:   Max phonation time of sustained "ah": average of 6 seconds (6.7, 5.3, 6 seconds)   Average time patient was able to sustain /s/: 6.5 seconds   Average time patient was able to sustain /z/: 3.9 seconds   s/z ratio: 1.6 (suggestive of dysfunction > 1.0)      Sound Pressure Level: 63 dB when reading and conversing   Pitch (Visi-Pitch): 241 Hz - considered within average of 244 Hz + 27        SLP Education - 02/29/20 1246    Education Details Behavioral Voice Therapy, POC, goals    Person(s) Educated Patient    Methods Explanation;Demonstration    Comprehension Verbalized understanding            SLP Short Term Goals - 02/29/20 1256      SLP SHORT TERM GOAL #1   Title The patient will maximize voice quality and loudness using breath support/oral resonance for sustained vowel production, pitch glides, and hierarchal speech drill with minimal assistance.    Baseline new goal    Time 10    Period --   sessions   Status New      SLP SHORT TERM GOAL #2   Title The patient will be moderately independent for abdominal breathing and breath support exercises.    Baseline new goal    Time 10    Period --   sessions   Status New      SLP SHORT TERM GOAL #3   Title The patient will decrease laryngeal and articulatory muscle tension by independently completing relaxation/stretching exercises.    Baseline new goal    Time 10    Period --   sessions     SLP SHORT TERM GOAL #4   Title Pt will identify environmental barriers to using a healthy voice and with moderate cues, create and implement a solution.    Baseline new goal    Time 10    Period --   sessions   Status New            SLP Long Term Goals - 02/29/20 1255      SLP LONG TERM GOAL #1   Title The patient will maximize voice quality and loudness using breath support/oral resonance for sustained vowel production, pitch glides, and hierarchal speech drill.    Baseline new goal    Time 8    Period Weeks    Status New    Target Date 04/25/20      SLP LONG TERM GOAL #2   Title The patient will be independent for abdominal breathing and breath support exercises.    Baseline new goal    Time 8    Period Weeks    Status New    Target Date 04/25/20      SLP LONG TERM GOAL #3   Title Pt will independently change environment to promote ability  to use a healthy voice.    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 In addition, pt further indicated that her voice makes it difficult for people to hear her, people have difficulty understanding her in a noisy environment, her voice restricts her social life, she ALWAYS has to strain to produce voice, the quality of her voice is ALWAYS unpredictable, her voice problem is ALWAYS upsetting to her and people frequently ask, "What's wrong with your voice?" The patient will benefit from voice therapy for education (vocal hygiene), to improve breath control for speech, and learn techniques to increase loudness and pitch range without strain.    Speech Therapy Frequency 2x / week    Duration --   8 weeks   Treatment/Interventions SLP instruction and feedback;Patient/family education    Potential to Achieve Goals Good    SLP Home Exercise Plan abdmoninal breathing exercises    Consulted and Agree with Plan of Care Patient           Patient will benefit from skilled therapeutic intervention in order to improve the following deficits and impairments:   Dysphonia    Problem List There are no problems to display for this patient.  Kyung Muto B. Dreama Saa M.S., CCC-SLP, [facility] Speech-Language Pathologist Rehabilitation Services Office [phone]  Coyle Stordahl B. Dreama Saa M.S., CCC-SLP, [facility] Speech-Language Pathologist Rehabilitation Services Office [phone]  Reuel Derby 02/29/2020, 1:01 PM  [place] [facility] MAIN [facility] SERVICES [address] [place], [place], [address] Phone: [phone]   Fax:  [phone]  Name: Amber Glover MRN: [id number] Date of Birth: Jul 27, 1961

## 2020-03-04 ENCOUNTER — Ambulatory Visit: Payer: BC Managed Care – PPO | Admitting: Speech Pathology

## 2020-03-06 ENCOUNTER — Ambulatory Visit: Payer: BC Managed Care – PPO | Admitting: Speech Pathology

## 2020-03-18 ENCOUNTER — Ambulatory Visit: Payer: BC Managed Care – PPO | Admitting: Speech Pathology

## 2020-03-20 ENCOUNTER — Other Ambulatory Visit: Payer: Self-pay

## 2020-03-20 ENCOUNTER — Ambulatory Visit: Payer: BC Managed Care – PPO | Attending: Otolaryngology | Admitting: Speech Pathology

## 2020-03-20 DIAGNOSIS — R490 Dysphonia: Secondary | ICD-10-CM | POA: Insufficient documentation

## 2020-03-21 ENCOUNTER — Encounter: Payer: Self-pay | Admitting: Speech Pathology

## 2020-03-21 NOTE — Therapy (Signed)
[place] MAIN [facility] SERVICES [address] [place], [place], [address] Phone: [phone]   Fax:  [phone]  Speech Language Pathology Treatment  Patient Details  Name: Amber Glover MRN: [id number] Date of Birth: [birth year] Referring Provider (SLP): Carloyn Manner   Encounter Date: 03/20/2020   End of Session - 03/21/20 0920    Visit Number 2    SLP Start Time 1300    SLP Stop Time  1400    SLP Time Calculation (min) 60 min    Activity Tolerance Patient tolerated treatment well           Past Medical History:  Diagnosis Date  . Heart murmur   . Hypertension     Past Surgical History:  Procedure Laterality Date  . CESAREAN SECTION    . uterine ablation      There were no vitals filed for this visit.   Subjective Assessment - 03/21/20 0917    Subjective pt pleasant    Currently in Pain? No/denies                 ADULT SLP TREATMENT - 03/21/20 0001      General Information   Behavior/Cognition Alert;Cooperative;Pleasant mood    HPI This is a 59 year old female with compliant of chronic hoarseness.  Her hoarseness is described as loss of voice and raspy. Pt has been experiencing hoarseness for over a year and she loses her voice when talking for long periods of time and for several months last year she had a constant dry cough. Pt had ENT evaluation with laryngoscopy on 02/13/2020. 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        Treatment Provided   Treatment provided Cognitive-Linquistic      Pain Assessment   Pain Assessment No/denies pain      Cognitive-Linquistic Treatment   Treatment focused on Voice;Patient/family/caregiver education    Skilled Treatment Reviewed vocal hygiene      Assessment / Recommendations / Plan   Plan Discharge SLP treatment due to (comment)   pt's busy professional and personal life     Progression Toward Goals   Progression toward goals Not  progressing toward goals (comment)   d/t pt's busy professional and personal life           SLP Education - 03/21/20 0920    Education Details will need another ENT evaluation and referral to resume voice therapy    Person(s) Educated Patient    Methods Explanation    Comprehension Verbalized understanding            SLP Short Term Goals - 03/21/20 0762      SLP SHORT TERM GOAL #1   Title The patient will maximize voice quality and loudness using breath support/oral resonance for sustained vowel production, pitch glides, and hierarchal speech drill with minimal assistance.    Status Not Met      SLP SHORT TERM GOAL #2   Title The patient will be moderately independent for abdominal breathing and breath support exercises.    Status Not Met      SLP SHORT TERM GOAL #3   Title The patient will decrease laryngeal and articulatory muscle tension by independently completing relaxation/stretching exercises.    Status Not Met      SLP SHORT TERM GOAL #4   Title Pt will identify environmental barriers to using a healthy voice and with moderate cues, create and implement a solution.  Status Not Met            SLP Long Term Goals - 03/21/20 9447      SLP LONG TERM GOAL #1   Title The patient will maximize voice quality and loudness using breath support/oral resonance for sustained vowel production, pitch glides, and hierarchal speech drill.    Status Not Met      SLP LONG TERM GOAL #2   Title The patient will be independent for abdominal breathing and breath support exercises.    Status Not Met      SLP LONG TERM GOAL #3   Title Pt will independently change environment to promote ability to use a healthy voice.    Status Not Met            Plan - 03/21/20 3958    Clinical Impression Statement Pt has attended 2 of 5 sessions and states that currently she is extremely busy with her personal obligations and her work life and wishes to discontinue ST services. As a result,  she has not met any of her goals.  Education has been provided on vocal hygiene and pt voiced understanding.     Consulted and Agree with Plan of Care Patient          Nakenya Theall B. Rutherford Nail M.S., CCC-SLP, [place] Pathologist Rehabilitation Services Office [phone]  Stormy Fabian 03/21/2020, 9:23 AM  [place] MAIN [facility] SERVICES [address] [place], [place], [address] Phone: [phone]   Fax:  [phone]   Name: Amber Glover MRN: [id number] Date of Birth: [birth year]

## 2020-03-24 ENCOUNTER — Ambulatory Visit: Payer: BC Managed Care – PPO | Admitting: Speech Pathology

## 2020-03-27 ENCOUNTER — Ambulatory Visit: Payer: BC Managed Care – PPO | Admitting: Speech Pathology

## 2020-04-01 ENCOUNTER — Ambulatory Visit: Payer: BC Managed Care – PPO | Admitting: Speech Pathology

## 2020-04-03 ENCOUNTER — Ambulatory Visit: Payer: BC Managed Care – PPO | Admitting: Speech Pathology

## 2020-04-10 ENCOUNTER — Ambulatory Visit: Payer: BC Managed Care – PPO | Admitting: Speech Pathology

## 2020-04-15 ENCOUNTER — Ambulatory Visit: Payer: BC Managed Care – PPO | Admitting: Speech Pathology

## 2020-04-17 ENCOUNTER — Ambulatory Visit: Payer: BC Managed Care – PPO | Admitting: Speech Pathology

## 2020-04-24 ENCOUNTER — Ambulatory Visit: Payer: BC Managed Care – PPO | Admitting: Speech Pathology

## 2020-04-29 ENCOUNTER — Ambulatory Visit: Payer: BC Managed Care – PPO | Admitting: Speech Pathology

## 2020-05-01 ENCOUNTER — Ambulatory Visit: Payer: BC Managed Care – PPO | Admitting: Speech Pathology

## 2020-08-06 ENCOUNTER — Encounter: Payer: Self-pay | Admitting: Plastic Surgery

## 2020-08-06 ENCOUNTER — Ambulatory Visit (INDEPENDENT_AMBULATORY_CARE_PROVIDER_SITE_OTHER): Payer: Self-pay | Admitting: Plastic Surgery

## 2020-08-06 ENCOUNTER — Other Ambulatory Visit: Payer: Self-pay

## 2020-08-06 VITALS — BP 121/79 | HR 88 | Temp 98.7°F | Ht 62.0 in | Wt 107.8 lb

## 2020-08-06 DIAGNOSIS — Z411 Encounter for cosmetic surgery: Secondary | ICD-10-CM

## 2020-08-06 NOTE — Progress Notes (Signed)
Referring Provider Shane Crutch, PA [address] [place],  [place] [address]   CC:  Chief Complaint  Patient presents with  . Advice Only      Amber Glover is an 60 y.o. female.  HPI: Patient presents to discuss a deflated right breast implant.  She initially had breast augmentation for cosmetic reasons in 1995.  She subsequently needed a needle biopsy on the right breast and the right implant was punctured.  This was replaced under local anesthesia in 2004 in [place].  She has 300 cc moderate profile saline implants placed which are around 300 cc in volume.  These were placed below the muscle.  She has had another consultation over in [place] but is here to see me for a second opinion and to hopefully get this done sooner based on the other surgeons schedule.  She is currently happy with the appearance and size in the left side.  Allergies  Allergen Reactions  . Caffeine Nausea Only    hypertension    Outpatient Encounter Medications as of 08/06/2020  Medication Sig  . Ascorbic Acid (VITAMIN C PO) Take by mouth daily.  . cholecalciferol (VITAMIN D) 1000 units tablet Take 1,000 Units by mouth daily.  . hydrochlorothiazide (HYDRODIURIL) 25 MG tablet Take 0.5 mg by mouth daily.  . Multiple Vitamin (MULTIVITAMIN) tablet Take 1 tablet by mouth daily.  . NON FORMULARY Relief Takes 1 tablet by mouth daily.  Marland Kitchen PREMPRO 0.3-1.5 MG tablet Take 1 tablet by mouth daily.  . [DISCONTINUED] valsartan (DIOVAN) 80 MG tablet Take 1 tablet by mouth daily.   No facility-administered encounter medications on file as of 08/06/2020.     Past Medical History:  Diagnosis Date  . Heart murmur   . Hypertension     Past Surgical History:  Procedure Laterality Date  . CESAREAN SECTION    . uterine ablation      Family History  Problem Relation Age of Onset  . Heart Problems Father     Social History   Social History Narrative  . Not on file  Denies tobacco use  Review of  Systems General: Denies fevers, chills, weight loss CV: Denies chest pain, shortness of breath, palpitations  Physical Exam Vitals with BMI 08/06/2020 06/30/2017 11/20/2015  Height 5\' 2"  5\' 2"  5\' 2"   Weight 107 lbs 13 oz 100 lbs 110 lbs 8 oz  BMI 19.71 18.29 20.3  Systolic 121 164  Diastolic 79 71 72  Pulse 88 80 73    General:  No acute distress,  Alert and oriented, Non-Toxic, Normal speech and affect Breast: She has bilateral inframammary crease incisions.  I do not see any other obvious scars.  She has a clearly deflated implant on the right and full implant on the left that is in reasonably good position.  I do not feel any firmness or concerns for capsular contracture.  Assessment/Plan Patient presents with deflated right saline breast implant.  We discussed replacing it with a similar sized saline implant.  She prefers saline for a number of reasons and I think is reasonable to continue that.  She still thinking about what she would like to do with the left side and whether or not she would like to switch that one out as well.  We discussed the risk of the procedure that include bleeding, infection, damage to surrounding structures and need for additional procedures.  We discussed the potential for future issues with breast implants that she has already  experienced.  She's happy with the left side in terms of size and position so we'll plan for around 300cc of volume in a moderate plus profile.  I explained the general postoperative process.  All of her questions were answered and we will plan to proceed with providing her with a quote.  Allena Napoleon 08/06/2020, 5:16 PM

## 2020-08-13 ENCOUNTER — Other Ambulatory Visit: Payer: Self-pay

## 2020-08-13 ENCOUNTER — Ambulatory Visit (INDEPENDENT_AMBULATORY_CARE_PROVIDER_SITE_OTHER): Payer: BC Managed Care – PPO | Admitting: Surgical

## 2020-08-13 ENCOUNTER — Encounter: Payer: Self-pay | Admitting: Surgical

## 2020-08-13 DIAGNOSIS — Z719 Counseling, unspecified: Secondary | ICD-10-CM

## 2020-08-13 DIAGNOSIS — Z411 Encounter for cosmetic surgery: Secondary | ICD-10-CM

## 2020-08-13 MED ORDER — HYDROCODONE-ACETAMINOPHEN 5-325 MG PO TABS: 1.0000 | ORAL_TABLET | Freq: Four times a day (QID) | ORAL | 0 refills | Status: AC | PRN

## 2020-08-13 MED ORDER — ONDANSETRON HCL 4 MG PO TABS: 4.0000 mg | ORAL_TABLET | Freq: Three times a day (TID) | ORAL | 0 refills | Status: DC | PRN

## 2020-08-13 NOTE — Progress Notes (Signed)
Patient ID: Amber Glover, female    DOB: October 26, 1960, 60 y.o.   MRN: [id number]  Chief Complaint  Patient presents with  . Pre-op Exam      ICD-10-CM   1. Encounter for cosmetic procedure  Z41.1     History of Present Illness: Amber Glover is a 60 y.o.  female  with a history of breast augmentation for cosmetic reasons in 1995.  Patient subsequently had right breast implant exchange in 2004 in [place].  She presents for preoperative evaluation for upcoming procedure, removal of bilateral breast implants, bilateral capsulectomy, scheduled for 08/16/2019 with Dr. Arita Miss.    The patient has not had problems with anesthesia. No history of DVT/PE.  No family history of DVT/PE.  No family or personal history of bleeding or clotting disorders.  Patient is not currently taking any blood thinners.  No history of CVA/MI.    Summary of Previous Visit: Patient initially had breast augmentation for cosmetic reasons in 1995.  She subsequently had a needle biopsy in the right breast and the implant was ruptured.  She then had this replaced under local anesthesia in 2004 in [place].  She has drained cc moderate profile saline implants placed which have around 300 cc in volume.  These were placed below the muscle.  Patient reports no history of any cardiac or pulmonary diseases.  Patient reports she is doing well, she reports that she is ready for surgery.   The patient gave consent to have this visit done by telemedicine / virtual visit, two identifiers were used to identify patient. This is also consent for access the chart and treat the patient via this visit. The patient is located at work.  I, the provider, am at the office.  We spent 10 minutes together for the visit.  Joined by telephone.    Past Medical History: Allergies: Allergies  Allergen Reactions  . Caffeine Nausea Only    hypertension    Current Medications:  Current Outpatient Medications:  .  HYDROcodone-acetaminophen (NORCO) 5-325  MG tablet, Take 1 tablet by mouth every 6 (six) hours as needed for up to 5 days for severe pain., Disp: 20 tablet, Rfl: 0 .  ondansetron (ZOFRAN) 4 MG tablet, Take 1 tablet (4 mg total) by mouth every 8 (eight) hours as needed for nausea or vomiting., Disp: 20 tablet, Rfl: 0 .  Ascorbic Acid (VITAMIN C PO), Take by mouth daily., Disp: , Rfl:  .  cholecalciferol (VITAMIN D) 1000 units tablet, Take 1,000 Units by mouth daily., Disp: , Rfl:  .  hydrochlorothiazide (HYDRODIURIL) 25 MG tablet, Take 0.5 mg by mouth daily., Disp: , Rfl:  .  Multiple Vitamin (MULTIVITAMIN) tablet, Take 1 tablet by mouth daily., Disp: , Rfl:  .  NON FORMULARY, Relief Takes 1 tablet by mouth daily., Disp: , Rfl:  .  PREMPRO 0.3-1.5 MG tablet, Take 1 tablet by mouth daily., Disp: , Rfl:   Past Medical Problems: Past Medical History:  Diagnosis Date  . Heart murmur   . Hypertension     Past Surgical History: Past Surgical History:  Procedure Laterality Date  . CESAREAN SECTION    . uterine ablation      Social History: Social History   Socioeconomic History  . Marital status: Single    Spouse name: Not on file  . Number of children: Not on file  . Years of education: Not on file  . Highest education level: Not on file  Occupational History  .  Not on file  Tobacco Use  . Smoking status: Never Smoker  . Smokeless tobacco: Never Used  Substance and Sexual Activity  . Alcohol use: Yes    Comment: social  . Drug use: No  . Sexual activity: Not Currently  Other Topics Concern  . Not on file  Social History Narrative  . Not on file   Social Determinants of Health   Financial Resource Strain: Not on file  Food Insecurity: Not on file  Transportation Needs: Not on file  Physical Activity: Not on file  Stress: Not on file  Social Connections: Not on file  Intimate Partner Violence: Not on file    Family History: Family History  Problem Relation Age of Onset  . Heart Problems Father      Review of Systems: Review of Systems  Constitutional: Negative.   Respiratory: Negative.   Cardiovascular: Negative.     Physical Exam: Vital Signs There were no vitals taken for this visit. No physical exam completed as patient was virtual telemetry visit.   Assessment/Plan: The patient is scheduled for removal of bilateral breast implants and capsulectomy with Dr. Arita Miss.  Risks, benefits, and alternatives of procedure discussed, questions answered and consent obtained.    Smoking Status: Non-smoker; Counseling Given?  N/A Last Mammogram: Patient reports last August, ; Results: Normal  Caprini Score: 3, moderate; Risk Factors include: Age, and length of planned surgery. Recommendation for mechanical prophylaxis. Encourage early ambulation.   Pictures obtained: No photos were taken, recommend taking on surgical day.  Post-op Rx sent to pharmacy: Norco, Zofran  We discussed the risks associated with removal of bilateral breast implants and capsulectomy.  We discussed risks including but not limited to bleeding, infection, damage to surrounding structures, postop fluid collections/seromas, loss of volume, ptosis after removal of implants.  Patient reports she is aware of the risks and wishes to proceed with surgery.  The verbal consent was obtained with risks and complications reviewed which included bleeding, pain, scar, infection and the risk of anesthesia.  The patients questions were answered to the patients expressed satisfaction.   Electronically signed by: Kermit Balo Leroi Haque, PA-C 08/13/2020 10:53 AM

## 2020-08-21 ENCOUNTER — Encounter: Payer: Self-pay | Admitting: Plastic Surgery

## 2020-08-21 ENCOUNTER — Encounter: Payer: BC Managed Care – PPO | Admitting: Plastic Surgery

## 2020-08-21 ENCOUNTER — Ambulatory Visit (INDEPENDENT_AMBULATORY_CARE_PROVIDER_SITE_OTHER): Payer: BC Managed Care – PPO | Admitting: Plastic Surgery

## 2020-08-21 ENCOUNTER — Other Ambulatory Visit: Payer: Self-pay

## 2020-08-21 VITALS — BP 137/79 | HR 65

## 2020-08-21 DIAGNOSIS — Z411 Encounter for cosmetic surgery: Secondary | ICD-10-CM

## 2020-08-21 NOTE — Progress Notes (Signed)
Patient presents about a week postop from removal of bilateral breast implants.  She is doing fine with no complaints.  On exam everything looks fine.  Steri-Strips are in place but incisions appear intact.  There is no subcutaneous fluid.  Her breasts are symmetric.  We discussed further postoperative expectations.  We discussed gradually increasing physical activity and listening to her body.  We discussed continuing to wear compressive garment.  We will plan to see her again in about a month.

## 2020-09-22 ENCOUNTER — Ambulatory Visit: Payer: BC Managed Care – PPO | Admitting: Surgical

## 2021-06-30 ENCOUNTER — Ambulatory Visit
Admission: RE | Admit: 2021-06-30 | Discharge: 2021-06-30 | Disposition: A | Discharge status: Home / Self Care | Payer: BC Managed Care – PPO | Attending: Nurse Practitioner | Admitting: Nurse Practitioner

## 2021-06-30 ENCOUNTER — Other Ambulatory Visit: Payer: Self-pay | Admitting: Nurse Practitioner

## 2021-06-30 ENCOUNTER — Ambulatory Visit
Admission: RE | Admit: 2021-06-30 | Discharge: 2021-06-30 | Disposition: A | Discharge status: Home / Self Care | Payer: BC Managed Care – PPO | Source: Ambulatory Visit | Attending: Nurse Practitioner | Admitting: Nurse Practitioner

## 2021-06-30 DIAGNOSIS — J069 Acute upper respiratory infection, unspecified: Secondary | ICD-10-CM | POA: Diagnosis not present

## 2021-07-03 ENCOUNTER — Other Ambulatory Visit: Payer: Self-pay | Admitting: Nurse Practitioner

## 2021-07-03 DIAGNOSIS — J069 Acute upper respiratory infection, unspecified: Secondary | ICD-10-CM

## 2022-01-06 IMAGING — CR DG CHEST 2V
1 series · 2 of 2 positions shown · non-contrast
Comparison: 10/17/2015

CLINICAL DATA: Cough. Fever. Symptoms for 6 days. Rule out
pneumonia.

EXAM:
CHEST - 2 VIEW

[Series 1: dg chest 2 view · 0.14mm/px · 2 of 2 slices shown]
[im 1/2]
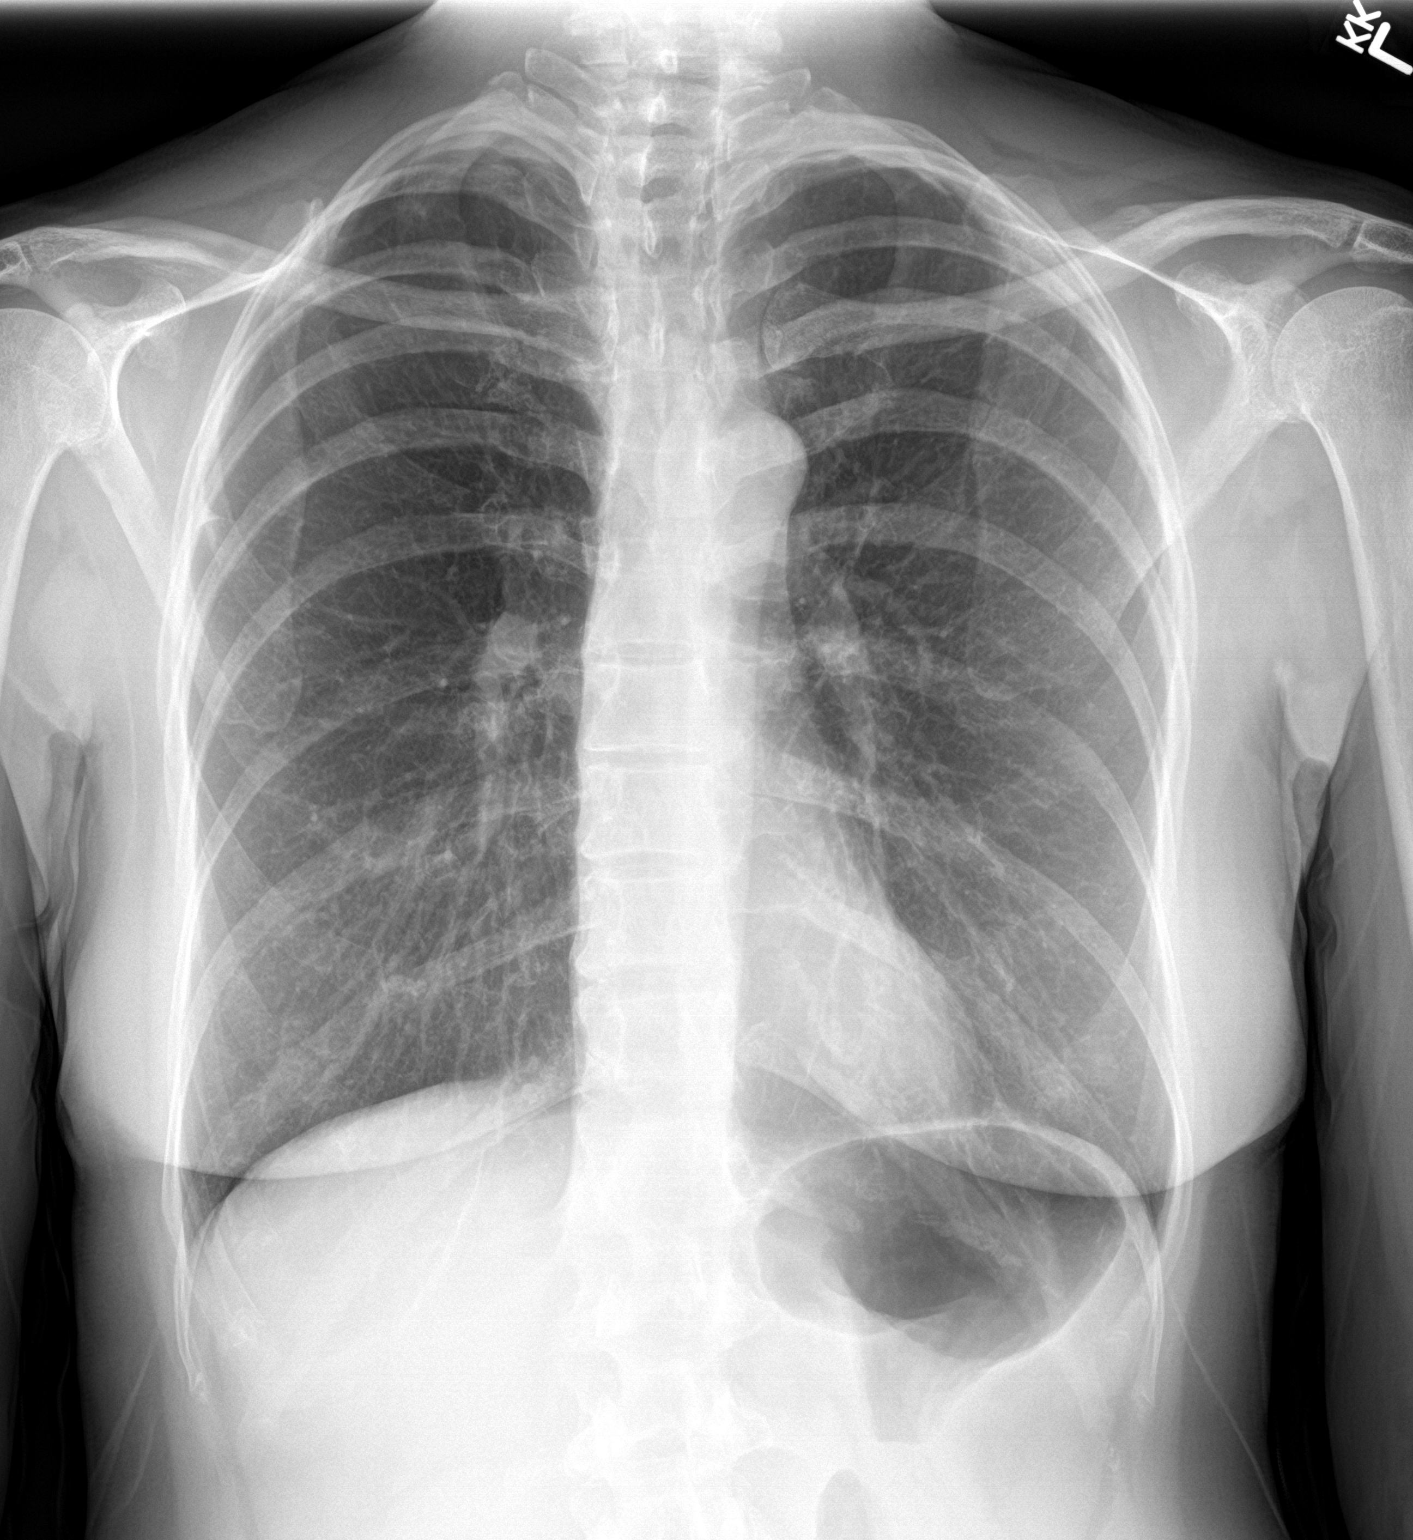
[im 2/2]
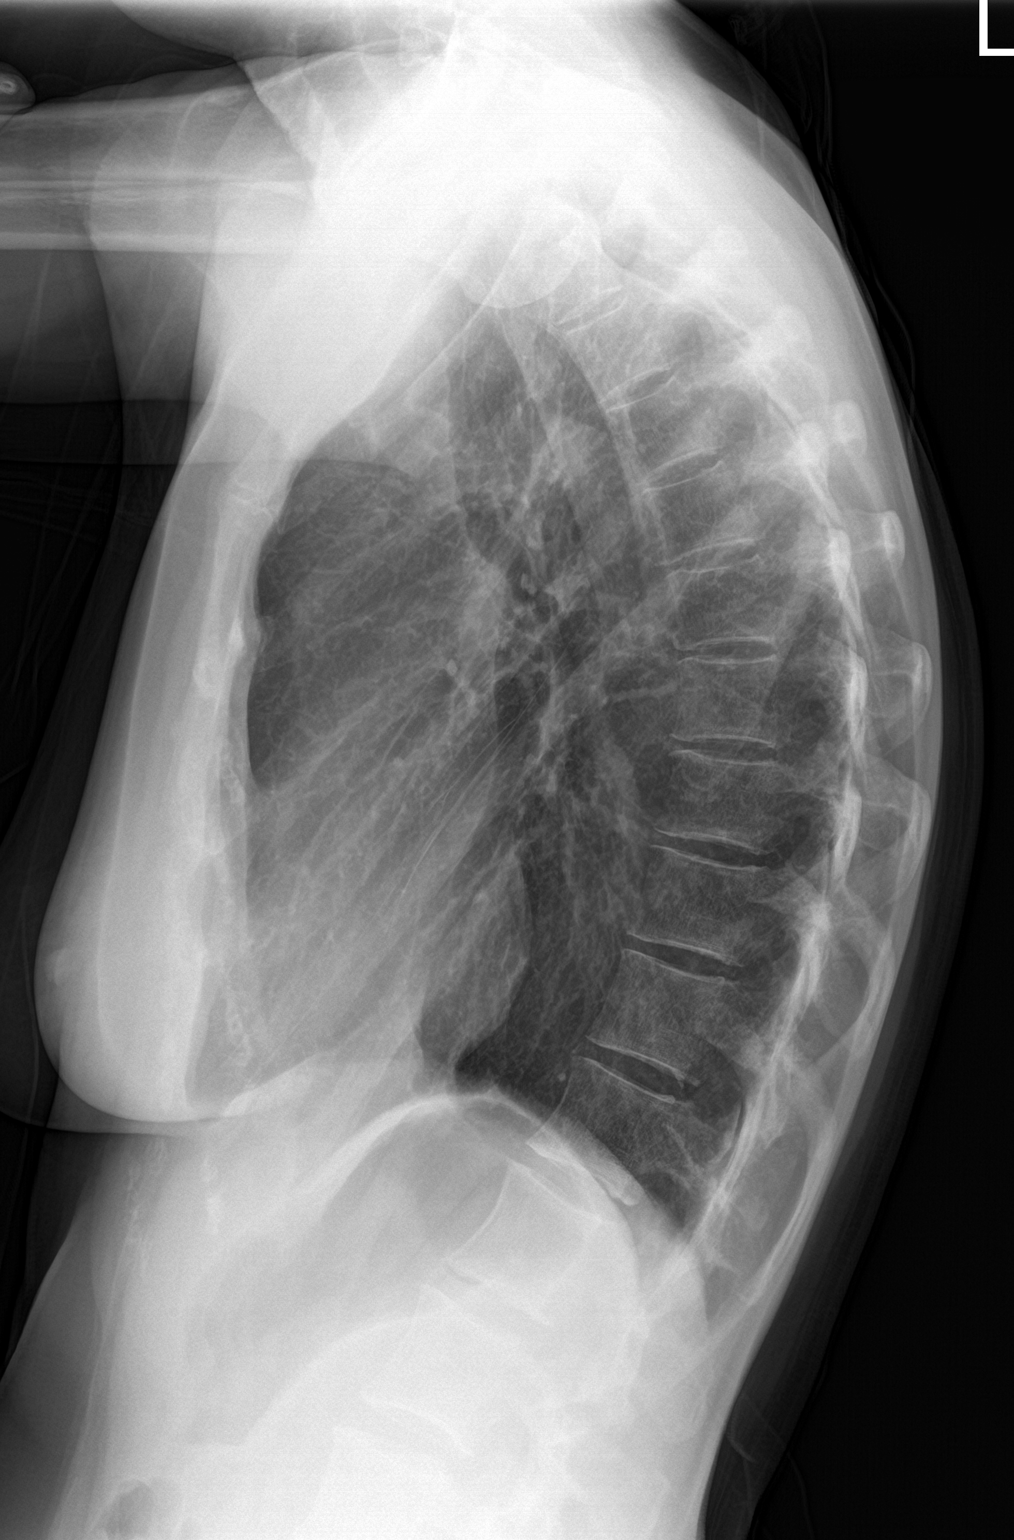

[2 of 2 positions shown; findings below may reference images not displayed]

FINDINGS: Mild pectus excavatum deformity. Patient rotated minimally left.
Midline trachea. Normal heart size and mediastinal contours. No
pleural effusion or pneumothorax. Mild biapical pleural thickening.
Probable nipple shadow projecting over the left lung base.
IMPRESSION: No acute cardiopulmonary disease.

Probable left-sided nipple shadow. Repeat frontal radiograph with
nipple markers could confirm.

## 2022-09-01 DIAGNOSIS — M7532 Calcific tendinitis of left shoulder: Secondary | ICD-10-CM | POA: Diagnosis not present

## 2022-09-20 DIAGNOSIS — Z1211 Encounter for screening for malignant neoplasm of colon: Secondary | ICD-10-CM | POA: Diagnosis not present

## 2022-10-11 DIAGNOSIS — M25512 Pain in left shoulder: Secondary | ICD-10-CM | POA: Diagnosis not present

## 2022-11-26 DIAGNOSIS — D2261 Melanocytic nevi of right upper limb, including shoulder: Secondary | ICD-10-CM | POA: Diagnosis not present

## 2022-11-26 DIAGNOSIS — D2272 Melanocytic nevi of left lower limb, including hip: Secondary | ICD-10-CM | POA: Diagnosis not present

## 2022-11-26 DIAGNOSIS — D225 Melanocytic nevi of trunk: Secondary | ICD-10-CM | POA: Diagnosis not present

## 2022-11-26 DIAGNOSIS — L821 Other seborrheic keratosis: Secondary | ICD-10-CM | POA: Diagnosis not present

## 2022-11-26 DIAGNOSIS — D2262 Melanocytic nevi of left upper limb, including shoulder: Secondary | ICD-10-CM | POA: Diagnosis not present

## 2022-11-26 DIAGNOSIS — L814 Other melanin hyperpigmentation: Secondary | ICD-10-CM | POA: Diagnosis not present

## 2022-11-26 DIAGNOSIS — Z872 Personal history of diseases of the skin and subcutaneous tissue: Secondary | ICD-10-CM | POA: Diagnosis not present

## 2022-11-26 DIAGNOSIS — Z09 Encounter for follow-up examination after completed treatment for conditions other than malignant neoplasm: Secondary | ICD-10-CM | POA: Diagnosis not present

## 2022-11-26 DIAGNOSIS — L570 Actinic keratosis: Secondary | ICD-10-CM | POA: Diagnosis not present

## 2022-11-26 DIAGNOSIS — D2271 Melanocytic nevi of right lower limb, including hip: Secondary | ICD-10-CM | POA: Diagnosis not present

## 2023-09-09 ENCOUNTER — Other Ambulatory Visit: Payer: Self-pay | Admitting: Family Medicine

## 2023-09-09 DIAGNOSIS — Z1231 Encounter for screening mammogram for malignant neoplasm of breast: Secondary | ICD-10-CM

## 2023-09-28 ENCOUNTER — Ambulatory Visit
Admission: RE | Admit: 2023-09-28 | Discharge: 2023-09-28 | Disposition: A | Discharge status: Home / Self Care | Payer: 59 | Source: Ambulatory Visit | Attending: Family Medicine

## 2023-09-28 DIAGNOSIS — Z1231 Encounter for screening mammogram for malignant neoplasm of breast: Secondary | ICD-10-CM
